# Patient Record
Sex: Male | Born: 1950 | Race: Asian | Marital: Married | Smoking: Never smoker
Health system: Southern US, Community
[De-identification: ages and names within clinical notes are randomized; demographics above are authoritative.]

## PROBLEM LIST (undated history)

## (undated) DIAGNOSIS — I10 Essential (primary) hypertension: Secondary | ICD-10-CM

## (undated) DIAGNOSIS — E785 Hyperlipidemia, unspecified: Secondary | ICD-10-CM

## (undated) HISTORY — DX: Hyperlipidemia, unspecified: E78.5

## (undated) HISTORY — DX: Essential (primary) hypertension: I10

---

## 2020-10-12 DIAGNOSIS — R7301 Impaired fasting glucose: Secondary | ICD-10-CM | POA: Diagnosis not present

## 2020-10-12 DIAGNOSIS — E559 Vitamin D deficiency, unspecified: Secondary | ICD-10-CM | POA: Diagnosis not present

## 2020-10-12 DIAGNOSIS — E7800 Pure hypercholesterolemia, unspecified: Secondary | ICD-10-CM | POA: Diagnosis not present

## 2021-01-18 DIAGNOSIS — I10 Essential (primary) hypertension: Secondary | ICD-10-CM | POA: Diagnosis not present

## 2021-01-18 DIAGNOSIS — Z1211 Encounter for screening for malignant neoplasm of colon: Secondary | ICD-10-CM | POA: Diagnosis not present

## 2021-01-18 DIAGNOSIS — E118 Type 2 diabetes mellitus with unspecified complications: Secondary | ICD-10-CM | POA: Diagnosis not present

## 2021-01-18 DIAGNOSIS — Z23 Encounter for immunization: Secondary | ICD-10-CM | POA: Diagnosis not present

## 2021-01-18 DIAGNOSIS — E559 Vitamin D deficiency, unspecified: Secondary | ICD-10-CM | POA: Diagnosis not present

## 2021-01-18 DIAGNOSIS — R0609 Other forms of dyspnea: Secondary | ICD-10-CM | POA: Diagnosis not present

## 2021-01-18 DIAGNOSIS — E7800 Pure hypercholesterolemia, unspecified: Secondary | ICD-10-CM | POA: Diagnosis not present

## 2021-01-19 ENCOUNTER — Ambulatory Visit: Payer: Medicare Other | Admitting: Cardiology

## 2021-01-19 ENCOUNTER — Encounter: Payer: Self-pay | Admitting: Cardiology

## 2021-01-19 ENCOUNTER — Other Ambulatory Visit: Payer: Self-pay

## 2021-01-19 VITALS — BP 143/80 | HR 88 | Temp 98.0°F | Resp 16 | Ht 70.0 in | Wt 217.0 lb

## 2021-01-19 NOTE — Progress Notes (Deleted)
   Primary Physician/Referring:  No primary care provider on file.  Patient ID: Keith Gomez, male    DOB: [birth year], 70 y.o.   MRN: [id number]  No chief complaint on file.  HPI:    Keith Gomez  is a 70 y.o. ***  No past medical history on file. *** The histories are not reviewed yet. Please review them in the "History" navigator section and refresh this SmartLink. No family history on file.  Social History   Tobacco Use   Smoking status: Not on file   Smokeless tobacco: Not on file  Substance Use Topics   Alcohol use: Not on file   Marital Status:    ROS  ***ROS Objective  There were no vitals taken for this visit. There is no height or weight on file to calculate BMI.  No flowsheet data found.   ***Physical Exam   Laboratory examination:   No results for input(s): NA, K, CL, CO2, GLUCOSE, BUN, CREATININE, CALCIUM, GFRNONAA, GFRAA in the last 8760 hours. CrCl cannot be calculated (No successful lab value found.).  No flowsheet data found. No flowsheet data found.  Lipid Panel No results for input(s): CHOL, TRIG, LDLCALC, VLDL, HDL, CHOLHDL, LDLDIRECT in the last 8760 hours. Lipid Panel  No results found for: CHOL, TRIG, HDL, CHOLHDL, VLDL, LDLCALC, LDLDIRECT, LABVLDL   HEMOGLOBIN A1C No results found for: HGBA1C, MPG TSH No results for input(s): TSH in the last 8760 hours.  External labs:   *** Medications and allergies  Not on File   Medication prior to this encounter:   No outpatient medications prior to visit.   No facility-administered medications prior to visit.     Medication list after today's encounter   No current outpatient medications  Radiology:   No results found.  Cardiac Studies:   ***  EKG:   ***  ***  Assessment  No diagnosis found.   There are no discontinued medications.  No orders of the defined types were placed in this encounter.  No orders of the defined types were placed in this  encounter.  Recommendations:   Keith Gomez is a 70 y.o. ***    Yates Decamp, MD, [facility] 01/19/2021, 6:39 AM Office: [phone]

## 2021-01-20 ENCOUNTER — Encounter: Payer: Self-pay | Admitting: Cardiology

## 2021-01-20 ENCOUNTER — Ambulatory Visit: Payer: Medicare Other | Admitting: Cardiology

## 2021-01-20 VITALS — BP 139/86 | HR 77 | Temp 98.5°F | Resp 17 | Ht 70.0 in | Wt 217.0 lb

## 2021-01-20 DIAGNOSIS — E119 Type 2 diabetes mellitus without complications: Secondary | ICD-10-CM

## 2021-01-20 DIAGNOSIS — R0609 Other forms of dyspnea: Secondary | ICD-10-CM | POA: Diagnosis not present

## 2021-01-20 DIAGNOSIS — E7800 Pure hypercholesterolemia, unspecified: Secondary | ICD-10-CM

## 2021-01-20 DIAGNOSIS — E6609 Other obesity due to excess calories: Secondary | ICD-10-CM

## 2021-01-20 DIAGNOSIS — I10 Essential (primary) hypertension: Secondary | ICD-10-CM | POA: Diagnosis not present

## 2021-01-20 DIAGNOSIS — Z6831 Body mass index (BMI) 31.0-31.9, adult: Secondary | ICD-10-CM

## 2021-01-20 DIAGNOSIS — R0600 Dyspnea, unspecified: Secondary | ICD-10-CM

## 2021-01-20 NOTE — Progress Notes (Signed)
Primary Physician/Referring:  Macarthur Critchley, MD  Patient ID: Keith Gomez, male    DOB: 1950-12-08, 70 y.o.   MRN: [id number]  Chief Complaint  Patient presents with   New Patient (Initial Visit)   Shortness of Breath    HPI:    Keith Gomez  is a 70 y.o. [place] male patient with hypertension, hyperlipidemia, vitamin D deficiency, diet-controlled diabetes mellitus with A1c of 6.5% recently referred to me for evaluation of chronic dyspnea on exertion and reduced physical activity tolerance.  Keith Gomez is accompanied by his wife, states that Keith Gomez is able to do most activities with mild limitation due to dyspnea on exertion and Keith Gomez is also noticed gradually decreasing exercise tolerance over several years.  Keith Gomez is a non-smoker  Past Medical History:  Diagnosis Date   Hyperlipidemia    Hypertension    History reviewed. No pertinent surgical history. Family History  Problem Relation Age of Onset   Liver disease Father    Heart attack Brother     Social History   Tobacco Use   Smoking status: Never   Smokeless tobacco: Never  Substance Use Topics   Alcohol use: Never   Marital Status:    ROS  Review of Systems  Cardiovascular:  Positive for dyspnea on exertion. Negative for chest pain and leg swelling.  Respiratory:  Positive for snoring.   Gastrointestinal:  Negative for melena.  Objective  Blood pressure 139/86, pulse 77, temperature 98.5 F (36.9 C), temperature source Temporal, resp. rate 17, height 5\' 10"  (1.778 m), weight 217 lb (98.4 kg), SpO2 98 %. Body mass index is 31.14 kg/m.  Vitals with BMI 01/20/2021 01/19/2021  Height 5\' 10"  5\' 10"   Weight 217 lbs 217 lbs  BMI 31.14 31.14  Systolic 139 143  Diastolic 86 80  Pulse 77 88     Physical Exam Constitutional:      Appearance: Keith Gomez is obese.  Neck:     Vascular: No carotid bruit or JVD.  Cardiovascular:     Rate and Rhythm: Normal rate and regular rhythm.     Pulses: Intact distal pulses.      Heart sounds: Normal heart sounds. No murmur heard.   No gallop.  Pulmonary:     Effort: Pulmonary effort is normal.     Breath sounds: Normal breath sounds.  Abdominal:     General: Bowel sounds are normal.     Palpations: Abdomen is soft.  Musculoskeletal:        General: No swelling.     Laboratory examination:   External labs:   Cholesterol, total 135.000 m 01/18/2021 HDL 39.000 mg 01/18/2021 LDL 81.000 mg 01/18/2021 Triglycerides 76.000 mg 01/18/2021  A1C 6.500 % 01/18/2021  Hemoglobin 16.000 g/d 10/01/2019  Creatinine, Serum 0.990 mg/ 01/18/2021 Potassium 4.500 mm 01/18/2021 ALT (SGPT) 19.000 U/L 01/18/2021  Medications and allergies  No Known Allergies   Medication prior to this encounter:   Outpatient Medications Prior to Visit  Medication Sig Dispense Refill   atorvastatin (LIPITOR) 20 MG tablet Take 20 mg by mouth daily.     fluticasone (FLONASE) 50 MCG/ACT nasal spray 1 spray in each nostril     loratadine (CLARITIN) 10 MG tablet 1 tablet     losartan (COZAAR) 50 MG tablet Take 50 mg by mouth daily.     Vitamin D, Ergocalciferol, (DRISDOL) 1.25 MG (50000 UNIT) CAPS capsule Take 50,000 Units by mouth once a week.     No facility-administered medications prior to visit.  Medication list after today's encounter   Current Outpatient Medications  Medication Instructions   atorvastatin (LIPITOR) 20 mg, Oral, Daily   fluticasone (FLONASE) 50 MCG/ACT nasal spray 1 spray in each nostril   loratadine (CLARITIN) 10 MG tablet 1 tablet   losartan (COZAAR) 50 mg, Oral, Daily   Vitamin D (Ergocalciferol) (DRISDOL) 50,000 Units, Oral, Weekly   Radiology:   No results found.  Cardiac Studies:   NA  EKG:   EKG 01/19/2021: Normal sinus rhythm at rate of 87 bpm, normal axis.  Incomplete right bundle branch block.  Nonspecific inferior T wave abnormality.    Assessment     ICD-10-CM   1. Dyspnea on exertion  R06.00 PCV ECHOCARDIOGRAM COMPLETE    PCV CARDIAC STRESS TEST     CT CARDIAC SCORING ([facility] LOCATIONS ONLY)    2. Hypercholesteremia  E78.00     3. Primary hypertension  I10     4. Type 2 diabetes mellitus without complication, without long-term current use of insulin (HCC)  E11.9     5. Class 1 obesity due to excess calories without serious comorbidity with body mass index (BMI) of 31.0 to 31.9 in adult  E66.09    Z68.31       There are no discontinued medications.  No orders of the defined types were placed in this encounter.  Orders Placed This Encounter  Procedures   CT CARDIAC SCORING ([facility] LOCATIONS ONLY)    Standing Status:   Future    Standing Expiration Date:   03/22/2021    Order Specific Question:   Preferred imaging location?    Answer:   [facility]   PCV CARDIAC STRESS TEST    Standing Status:   Future    Standing Expiration Date:   03/22/2021   PCV ECHOCARDIOGRAM COMPLETE    Standing Status:   Future    Standing Expiration Date:   01/20/2022   Recommendations:   Keith Gomez is a 70 y.o. [place] male patient with hypertension, hyperlipidemia, vitamin D deficiency, diet-controlled diabetes mellitus with A1c of 6.5% on 01/18/2021 referred to me for evaluation of chronic dyspnea on exertion and reduced physical activity tolerance.  Keith Gomez is accompanied by his wife, states that Keith Gomez is able to do most activities with mild limitation due to dyspnea on exertion and Keith Gomez is also noticed gradually decreasing exercise tolerance.  Keith Gomez is a non-smoker, but has significant cardiovascular is factors however lipids are well controlled, blood pressure is slightly elevated, Keith Gomez does have mild obesity and diabetes mellitus as risk factors.  His physical examination and EKG are unremarkable.  I have recommended coronary calcium score and a routine treadmill exercise stress test along with an echocardiogram.  I had an extensive discussion with the patient and his wife at the bedside regarding reducing calorie intake and also discussed regarding healthy eating habits  so his blood pressure and hyperglycemia could also improve.  I will follow-up on his blood pressure and on his next office visit if still elevated, will consider either addition of second agent or increasing the dose of the losartan.  To give time for making lifestyle changes, I will see him back in 3 months as his symptoms are ongoing for several years.    Yates Decamp, MD, [facility] 01/20/2021, 3:40 PM Office: [phone]

## 2021-01-31 ENCOUNTER — Other Ambulatory Visit: Payer: Self-pay | Admitting: Cardiology

## 2021-01-31 DIAGNOSIS — R0600 Dyspnea, unspecified: Secondary | ICD-10-CM

## 2021-01-31 DIAGNOSIS — R0609 Other forms of dyspnea: Secondary | ICD-10-CM

## 2021-02-07 DIAGNOSIS — Z23 Encounter for immunization: Secondary | ICD-10-CM | POA: Diagnosis not present

## 2021-02-07 DIAGNOSIS — S81011A Laceration without foreign body, right knee, initial encounter: Secondary | ICD-10-CM | POA: Diagnosis not present

## 2021-02-12 NOTE — Progress Notes (Signed)
Patient rescheduled for next day

## 2021-02-14 DIAGNOSIS — E118 Type 2 diabetes mellitus with unspecified complications: Secondary | ICD-10-CM | POA: Diagnosis not present

## 2021-02-14 DIAGNOSIS — E7800 Pure hypercholesterolemia, unspecified: Secondary | ICD-10-CM | POA: Diagnosis not present

## 2021-02-14 DIAGNOSIS — E559 Vitamin D deficiency, unspecified: Secondary | ICD-10-CM | POA: Diagnosis not present

## 2021-02-14 DIAGNOSIS — I10 Essential (primary) hypertension: Secondary | ICD-10-CM | POA: Diagnosis not present

## 2021-02-14 DIAGNOSIS — R0609 Other forms of dyspnea: Secondary | ICD-10-CM | POA: Diagnosis not present

## 2021-04-13 ENCOUNTER — Inpatient Hospital Stay: Admission: RE | Admit: 2021-04-13 | Payer: Medicare Other | Source: Ambulatory Visit

## 2021-04-21 ENCOUNTER — Ambulatory Visit: Payer: Medicare Other

## 2021-04-21 ENCOUNTER — Other Ambulatory Visit: Payer: Self-pay

## 2021-04-21 DIAGNOSIS — R0609 Other forms of dyspnea: Secondary | ICD-10-CM | POA: Diagnosis not present

## 2021-05-01 ENCOUNTER — Ambulatory Visit: Payer: Medicare Other | Admitting: Cardiology

## 2021-05-10 ENCOUNTER — Ambulatory Visit: Payer: Medicare Other | Admitting: Cardiology

## 2021-05-10 ENCOUNTER — Encounter: Payer: Self-pay | Admitting: Cardiology

## 2021-05-10 ENCOUNTER — Other Ambulatory Visit: Payer: Self-pay

## 2021-05-10 VITALS — BP 127/88 | HR 68 | Temp 98.1°F | Ht 70.0 in | Wt 220.0 lb

## 2021-05-10 DIAGNOSIS — I77810 Thoracic aortic ectasia: Secondary | ICD-10-CM | POA: Diagnosis not present

## 2021-05-10 DIAGNOSIS — I10 Essential (primary) hypertension: Secondary | ICD-10-CM

## 2021-05-10 DIAGNOSIS — R0609 Other forms of dyspnea: Secondary | ICD-10-CM

## 2021-05-10 NOTE — Progress Notes (Signed)
Primary Physician/Referring:  Keith Critchley, MD  Patient ID: Keith Gomez, male    DOB: Aug 11, 1950, 70 y.o.   MRN: [id number]  Chief Complaint  Patient presents with   Shortness of Breath   Follow-up   Results    HPI:    Keith Gomez  is a 70 y.o. [place] male patient with hypertension, hyperlipidemia, vitamin D deficiency, diet-controlled diabetes mellitus with A1c of 6.5% recently referred to me for evaluation of chronic dyspnea on exertion and reduced physical activity tolerance.  States that he is able to do most activities with mild limitation due to dyspnea on exertion and he is also noticed gradually decreasing exercise tolerance over several years.  He is a non-smoker.  I had seen him about 3 months ago, I recommended a routine treadmill stress test and echocardiogram and coronary calcium score.  He now presents for follow-up.  No new symptoms.  Past Medical History:  Diagnosis Date   Hyperlipidemia    Hypertension    History reviewed. No pertinent surgical history. Family History  Problem Relation Age of Onset   Liver disease Father    Heart attack Brother     Social History   Tobacco Use   Smoking status: Never   Smokeless tobacco: Never  Substance Use Topics   Alcohol use: Never   Marital Status:    ROS  Review of Systems  Cardiovascular:  Positive for dyspnea on exertion. Negative for chest pain and leg swelling.  Respiratory:  Positive for snoring.   Gastrointestinal:  Negative for melena.  Objective  Blood pressure 127/88, pulse 68, temperature 98.1 F (36.7 C), temperature source Temporal, height 5\' 10"  (1.778 m), weight 220 lb (99.8 kg), SpO2 98 %. Body mass index is 31.57 kg/m.  Vitals with BMI 05/10/2021 01/20/2021 01/19/2021  Height 5\' 10"  5\' 10"  5\' 10"   Weight 220 lbs 217 lbs 217 lbs  BMI 31.57 31.14 31.14  Systolic 127 139 03/21/2021  Diastolic 88 86 80  Pulse 68 77 88     Physical Exam Constitutional:      Appearance: He is  obese.  Neck:     Vascular: No carotid bruit or JVD.  Cardiovascular:     Rate and Rhythm: Normal rate and regular rhythm.     Pulses: Intact distal pulses.     Heart sounds: Normal heart sounds. No murmur heard.   No gallop.  Pulmonary:     Effort: Pulmonary effort is normal.     Breath sounds: Normal breath sounds.  Abdominal:     General: Bowel sounds are normal.     Palpations: Abdomen is soft.  Musculoskeletal:        General: No swelling.     Laboratory examination:   External labs:   Cholesterol, total 135.000 m 01/18/2021 HDL 39.000 mg 01/18/2021 LDL 81.000 mg 01/18/2021 Triglycerides 76.000 mg 01/18/2021  A1C 6.500 % 01/18/2021  Hemoglobin 16.000 g/d 10/01/2019  Creatinine, Serum 0.990 mg/ 01/18/2021 Potassium 4.500 mm 01/18/2021 ALT (SGPT) 19.000 U/L 01/18/2021  Medications and allergies  No Known Allergies   Medication prior to this encounter:   Outpatient Medications Prior to Visit  Medication Sig Dispense Refill   aspirin EC 81 MG tablet Take 81 mg by mouth daily. Swallow whole.     atorvastatin (LIPITOR) 20 MG tablet Take 20 mg by mouth daily.     fluticasone (FLONASE) 50 MCG/ACT nasal spray 1 spray in each nostril     losartan (COZAAR) 50 MG tablet  Take 50 mg by mouth daily.     Vitamin D, Ergocalciferol, (DRISDOL) 1.25 MG (50000 UNIT) CAPS capsule Take 50,000 Units by mouth once a week.     loratadine (CLARITIN) 10 MG tablet 1 tablet     No facility-administered medications prior to visit.    Medication list after today's encounter   Current Outpatient Medications  Medication Instructions   aspirin EC 81 mg, Oral, Daily, Swallow whole.   atorvastatin (LIPITOR) 20 mg, Oral, Daily   fluticasone (FLONASE) 50 MCG/ACT nasal spray 1 spray in each nostril   losartan (COZAAR) 50 mg, Oral, Daily   Vitamin D (Ergocalciferol) (DRISDOL) 50,000 Units, Oral, Weekly   Radiology:   No results found.  Cardiac Studies:   PCV ECHOCARDIOGRAM COMPLETE  04/21/2021  Narrative Echocardiogram 04/21/2021: Normal LV systolic function with visual EF 60-65%. Left ventricle cavity is normal in size. Normal left ventricular wall thickness. Normal global wall motion. Normal diastolic filling pattern, normal LAP. No significant valvular heart disease. Proximal aorta mildly dilated at 75mm. No prior study for comparison.    PCV CARDIAC STRESS TEST 04/21/2021  Narrative Exercise treadmill stress test 04/21/2021: Functional status: Good. Chest pain: No. Reason for stopping exercise: Fatigue/weakness. Hypertensive response to exercise: No. Exercise time 7 minutes 00 seconds on Bruce protocol, achieved 8.6 METS, 100% of age-predicted maximum heart rate. Stress ECG negative for ischemia. Low risk study.    EKG:   EKG 01/19/2021: Normal sinus rhythm at rate of 87 bpm, normal axis.  Incomplete right bundle branch block.  Nonspecific inferior T wave abnormality.    Assessment     ICD-10-CM   1. Aortic root dilatation (HCC)  I77.810 PCV ECHOCARDIOGRAM COMPLETE    2. Dyspnea on exertion  R06.09 PCV ECHOCARDIOGRAM COMPLETE    3. Primary hypertension  I10       Medications Discontinued During This Encounter  Medication Reason   loratadine (CLARITIN) 10 MG tablet     No orders of the defined types were placed in this encounter.   Orders Placed This Encounter  Procedures   PCV ECHOCARDIOGRAM COMPLETE    Standing Status:   Future    Standing Expiration Date:   05/10/2022    Scheduling Instructions:     Prior to next OV in a year   Recommendations:   Keith Gomez is a 70 y.o. [place] male patient with hypertension, hyperlipidemia, vitamin D deficiency, diet-controlled diabetes mellitus with A1c of 6.5% on 01/18/2021 referred to me for evaluation of chronic dyspnea on exertion and reduced physical activity tolerance.  He is accompanied by his wife, states that he is able to do most activities with mild limitation due to dyspnea on  exertion and he is also noticed gradually decreasing exercise tolerance.  He is a non-smoker, but has significant cardiovascular is factors however lipids are well controlled, blood pressure is slightly elevated, he does have mild obesity and diet diabetes mellitus as risk factors.  His physical examination and EKG are unremarkable.  I have recommended coronary calcium score and a routine treadmill exercise stress test along with an echocardiogram.  He [place] however I reviewed the results of the stress test and echocardiogram.  He has normal/low risk treadmill exercise stress test however echocardiogram reveals aortic root dilatation.  Again coronary calcium score will also in addition given the measurements of the aortic root.  He is willing to get this done soon.  If  indeed he has high coronary calcium score, I may consider reducing his LDL even further to <70.  I will repeat echocardiogram in a year and I will see him back at that time.      Keith Decamp, MD, [facility] 05/10/2021, 12:20 PM Office: [phone]

## 2021-05-17 DIAGNOSIS — H25013 Cortical age-related cataract, bilateral: Secondary | ICD-10-CM | POA: Diagnosis not present

## 2021-05-17 DIAGNOSIS — H2513 Age-related nuclear cataract, bilateral: Secondary | ICD-10-CM | POA: Diagnosis not present

## 2021-05-17 DIAGNOSIS — E119 Type 2 diabetes mellitus without complications: Secondary | ICD-10-CM | POA: Diagnosis not present

## 2021-05-17 DIAGNOSIS — H524 Presbyopia: Secondary | ICD-10-CM | POA: Diagnosis not present

## 2021-06-07 ENCOUNTER — Ambulatory Visit
Admission: RE | Admit: 2021-06-07 | Discharge: 2021-06-07 | Disposition: A | Discharge status: Home / Self Care | Payer: Self-pay | Source: Ambulatory Visit | Attending: Cardiology | Admitting: Cardiology

## 2021-06-07 DIAGNOSIS — R0609 Other forms of dyspnea: Secondary | ICD-10-CM

## 2021-06-10 ENCOUNTER — Other Ambulatory Visit: Payer: Self-pay | Admitting: Cardiology

## 2021-06-10 DIAGNOSIS — R931 Abnormal findings on diagnostic imaging of heart and coronary circulation: Secondary | ICD-10-CM

## 2021-06-10 DIAGNOSIS — E7800 Pure hypercholesterolemia, unspecified: Secondary | ICD-10-CM

## 2021-06-10 MED ORDER — ATORVASTATIN CALCIUM 40 MG PO TABS: 40.0000 mg | ORAL_TABLET | Freq: Every day | ORAL | 3 refills | Status: DC

## 2021-06-10 NOTE — Progress Notes (Signed)
Coronary calcium score 06/07/2021: LM 71.2, LAD 225, LCx 1.9, RCA 168, Total Agatston score 467.  [place] database percentile 89. Ascending aorta is mildly dilated at 38 mm and descending thoracic aorta normal.  Visualized extracardiac structures normal. Will increase atorvastatin from 20 mg to 40 mg in view of elevated coronary calcium score.  Prescription sent.    ICD-10-CM   1. Hypercholesteremia  E78.00 atorvastatin (LIPITOR) 40 MG tablet    2. Elevated coronary artery calcium score 06/07/21 Total Agatston score 467, [place] database 89th percentile  R93.1 atorvastatin (LIPITOR) 40 MG tablet

## 2021-06-10 NOTE — Progress Notes (Signed)
Coronary calcium score 06/07/2021: LM 71.2, LAD 225, LCx 1.9, RCA 168, Total Agatston score 467.  [place] database percentile 89. Ascending aorta is mildly dilated at 38 mm and descending thoracic aorta normal.  Visualized extracardiac structures normal. Will increase atorvastatin from 10 mg to 20 mg in view of elevated coronary calcium score.  Prescription sent.

## 2021-06-10 NOTE — Progress Notes (Signed)
Let him know his coronary calcium score was elevated, I have sent in a new prescription for higher dose of atorvastatin, he can double up on 20 mg dose he has until he is done and then can start with 40 mg new Rx

## 2021-06-12 NOTE — Progress Notes (Signed)
Called and spoke with patient regarding his CT cardiac scoring results. Patient will double up and when finished, he will start the 40mg .

## 2021-07-31 DIAGNOSIS — E7800 Pure hypercholesterolemia, unspecified: Secondary | ICD-10-CM | POA: Diagnosis not present

## 2021-07-31 DIAGNOSIS — E559 Vitamin D deficiency, unspecified: Secondary | ICD-10-CM | POA: Diagnosis not present

## 2021-07-31 DIAGNOSIS — E118 Type 2 diabetes mellitus with unspecified complications: Secondary | ICD-10-CM | POA: Diagnosis not present

## 2021-08-10 DIAGNOSIS — E7800 Pure hypercholesterolemia, unspecified: Secondary | ICD-10-CM | POA: Diagnosis not present

## 2021-08-10 DIAGNOSIS — Z7185 Encounter for immunization safety counseling: Secondary | ICD-10-CM | POA: Diagnosis not present

## 2021-08-10 DIAGNOSIS — I10 Essential (primary) hypertension: Secondary | ICD-10-CM | POA: Diagnosis not present

## 2021-08-10 DIAGNOSIS — J302 Other seasonal allergic rhinitis: Secondary | ICD-10-CM | POA: Diagnosis not present

## 2021-08-10 DIAGNOSIS — I2510 Atherosclerotic heart disease of native coronary artery without angina pectoris: Secondary | ICD-10-CM | POA: Diagnosis not present

## 2021-08-10 DIAGNOSIS — E118 Type 2 diabetes mellitus with unspecified complications: Secondary | ICD-10-CM | POA: Diagnosis not present

## 2022-02-21 DIAGNOSIS — B359 Dermatophytosis, unspecified: Secondary | ICD-10-CM | POA: Diagnosis not present

## 2022-02-21 DIAGNOSIS — I10 Essential (primary) hypertension: Secondary | ICD-10-CM | POA: Diagnosis not present

## 2022-02-21 DIAGNOSIS — E559 Vitamin D deficiency, unspecified: Secondary | ICD-10-CM | POA: Diagnosis not present

## 2022-02-21 DIAGNOSIS — Z23 Encounter for immunization: Secondary | ICD-10-CM | POA: Diagnosis not present

## 2022-02-21 DIAGNOSIS — E7800 Pure hypercholesterolemia, unspecified: Secondary | ICD-10-CM | POA: Diagnosis not present

## 2022-02-21 DIAGNOSIS — Z0000 Encounter for general adult medical examination without abnormal findings: Secondary | ICD-10-CM | POA: Diagnosis not present

## 2022-02-21 DIAGNOSIS — Z1211 Encounter for screening for malignant neoplasm of colon: Secondary | ICD-10-CM | POA: Diagnosis not present

## 2022-02-21 DIAGNOSIS — E118 Type 2 diabetes mellitus with unspecified complications: Secondary | ICD-10-CM | POA: Diagnosis not present

## 2022-05-04 ENCOUNTER — Other Ambulatory Visit: Payer: Medicare Other

## 2022-05-10 ENCOUNTER — Encounter: Payer: Self-pay | Admitting: Cardiology

## 2022-05-10 ENCOUNTER — Ambulatory Visit: Payer: Medicare Other | Admitting: Cardiology

## 2022-05-10 ENCOUNTER — Other Ambulatory Visit: Payer: Medicare Other

## 2022-05-10 VITALS — BP 123/73 | HR 73 | Ht 70.0 in | Wt 220.0 lb

## 2022-05-10 DIAGNOSIS — R931 Abnormal findings on diagnostic imaging of heart and coronary circulation: Secondary | ICD-10-CM | POA: Diagnosis not present

## 2022-05-10 DIAGNOSIS — I77810 Thoracic aortic ectasia: Secondary | ICD-10-CM | POA: Diagnosis not present

## 2022-05-10 DIAGNOSIS — E7800 Pure hypercholesterolemia, unspecified: Secondary | ICD-10-CM

## 2022-05-10 DIAGNOSIS — I10 Essential (primary) hypertension: Secondary | ICD-10-CM | POA: Diagnosis not present

## 2022-05-10 NOTE — Progress Notes (Signed)
Primary Physician/Referring:  Macarthur Critchley, MD  Patient ID: Keith Gomez, male    DOB: [birth year], 71 y.o.   MRN: [id number]  Chief Complaint  Patient presents with   Aortic root dilatation   Follow-up    1 year    HPI:    [name]  is a 71 y.o.  [place] male patient with hypertension, hyperlipidemia, vitamin D deficiency, diet-controlled diabetes mellitus, elevated coronary calcium score in the 89th percentile in January 2023, very mild ascending aortic dilatation at 3.8 cm, seen for chronic dyspnea on exertion and reduced physical activity tolerance.  Continue doing well and remains asymptomatic except for mild chronic dyspnea.  Past Medical History:  Diagnosis Date   Hyperlipidemia    Hypertension    No past surgical history on file. Family History  Problem Relation Age of Onset   Liver disease Father    Heart attack Brother     Social History   Tobacco Use   Smoking status: Never   Smokeless tobacco: Never  Substance Use Topics   Alcohol use: Never   Marital Status:    ROS  Review of Systems  Cardiovascular:  Positive for dyspnea on exertion. Negative for chest pain and leg swelling.  Respiratory:  Positive for snoring.   Gastrointestinal:  Negative for melena.   Objective  There were no vitals taken for this visit. There is no height or weight on file to calculate BMI.     05/10/2021   11:18 AM 01/20/2021   11:33 AM 01/19/2021    8:24 AM  Vitals with BMI  Height 5\' 10"  5\' 10"  5\' 10"   Weight 220 lbs 217 lbs 217 lbs  BMI 31.57 31.14 31.14  Systolic 127 139  Diastolic 88 86 80  Pulse 68 77 88     Physical Exam Constitutional:      Appearance: He is obese.  Neck:     Vascular: No carotid bruit or JVD.  Cardiovascular:     Rate and Rhythm: Normal rate and regular rhythm.     Pulses: Intact distal pulses.     Heart sounds: Normal heart sounds. No murmur heard.    No gallop.  Pulmonary:     Effort: Pulmonary effort is  normal.     Breath sounds: Normal breath sounds.  Abdominal:     General: Bowel sounds are normal.     Palpations: Abdomen is soft.  Musculoskeletal:     Right lower leg: No edema.     Left lower leg: No edema.      Laboratory examination:   External labs:   Cholesterol, total 132.000 m 02/21/2022 HDL 46.000 mg 02/21/2022 LDL 70.000 mg 02/21/2022 Triglycerides 83.000 mg 02/21/2022  A1C 6.600 % 02/21/2022  Creatinine, Serum 0.950 mg/ 02/21/2022 Potassium 4.300 mm 02/21/2022 ALT (SGPT) 26.000 U/L 02/21/2022  Medications and allergies  No Known Allergies    Medication list after today's encounter   Current Outpatient Medications:    aspirin EC 81 MG tablet, Take 81 mg by mouth daily. Swallow whole., Disp: , Rfl:    atorvastatin (LIPITOR) 40 MG tablet, Take 1 tablet (40 mg total) by mouth daily., Disp: 90 tablet, Rfl: 3   fluticasone (FLONASE) 50 MCG/ACT nasal spray, 1 spray in each nostril, Disp: , Rfl:    losartan (COZAAR) 50 MG tablet, Take 50 mg by mouth daily., Disp: , Rfl:    Vitamin D, Ergocalciferol, (DRISDOL) 1.25 MG (50000 UNIT) CAPS capsule, Take 50,000 Units by mouth  once a week., Disp: , Rfl:   Radiology:   Coronary calcium score 06/07/2021: LM 71.2, LAD 225, LCx 1.9, RCA 168, Total Agatston score 467.  [place] database percentile 89. Ascending aorta is mildly dilated at 38 mm and descending thoracic aorta normal.  Visualized extracardiac structures normal.  Cardiac Studies:   PCV ECHOCARDIOGRAM COMPLETE 04/21/2021  Narrative Echocardiogram 04/21/2021: Normal LV systolic function with visual EF 60-65%. Left ventricle cavity is normal in size. Normal left ventricular wall thickness. Normal global wall motion. Normal diastolic filling pattern, normal LAP. No significant valvular heart disease. Proximal aorta mildly dilated at 76mm. No prior study for comparison.    PCV CARDIAC STRESS TEST 04/21/2021  Narrative Exercise treadmill stress test  04/21/2021: Functional status: Good. Chest pain: No. Reason for stopping exercise: Fatigue/weakness. Hypertensive response to exercise: No. Exercise time 7 minutes 00 seconds on Bruce protocol, achieved 8.6 METS, 100% of age-predicted maximum heart rate. Stress ECG negative for ischemia. Low risk study.    EKG:   EKG 09/08/2021: Normal sinus rhythm at rate of 67 bpm, normal axis.  Incomplete left bundle branch block.  No evidence of ischemia, normal EKG.  No significant change from 01/19/2021.    Assessment     ICD-10-CM   1. Aortic root dilatation (HCC)  I77.810     2. Elevated coronary artery calcium score 06/07/21 Total Agatston score 467, [place] database 89th percentile  R93.1     3. Hypercholesteremia  E78.00     4. Primary hypertension  I10       There are no discontinued medications.   No orders of the defined types were placed in this encounter.   No orders of the defined types were placed in this encounter.  Recommendations:   [name] is a 71 y.o. [place] male patient with hypertension, hyperlipidemia, vitamin D deficiency, diet-controlled diabetes mellitus, elevated coronary calcium score in the 89th percentile in January 2023, very mild ascending aortic dilatation at 3.8 cm, seen for chronic dyspnea on exertion and reduced physical activity tolerance.  Continue doing well and remains asymptomatic except for mild chronic dyspnea.  1. Aortic root dilatation (HCC) Very mild aortic root dilatation, probably normal variant for his age and hypertension.  Echocardiogram is been ordered, if this remains stable, I do not think he needs annual surveillance.  2. Elevated coronary artery calcium score 06/07/21 Total Agatston score 467, [place] database 89th percentile He is now on statin therapy, lipids under excellent control.  He has not had any chest pain or dyspnea.  3. Hypercholesteremia Lipids under excellent control.  External labs reviewed.  4. Primary  hypertension Blood pressure also well-controlled on ARB.  I did not make any changes to his medications.  I will see him back in a year or sooner if problems. If he remains stable, I will see him back on a as needed basis.    Yates Decamp, MD, [facility] 05/10/2022, 11:04 AM Office: [phone]

## 2022-05-11 DIAGNOSIS — M25512 Pain in left shoulder: Secondary | ICD-10-CM | POA: Diagnosis not present

## 2022-06-12 ENCOUNTER — Other Ambulatory Visit: Payer: Self-pay | Admitting: Cardiology

## 2022-06-12 DIAGNOSIS — E7800 Pure hypercholesterolemia, unspecified: Secondary | ICD-10-CM

## 2022-06-12 DIAGNOSIS — R931 Abnormal findings on diagnostic imaging of heart and coronary circulation: Secondary | ICD-10-CM

## 2022-12-19 DIAGNOSIS — E559 Vitamin D deficiency, unspecified: Secondary | ICD-10-CM | POA: Diagnosis not present

## 2022-12-19 DIAGNOSIS — I2510 Atherosclerotic heart disease of native coronary artery without angina pectoris: Secondary | ICD-10-CM | POA: Diagnosis not present

## 2022-12-19 DIAGNOSIS — E118 Type 2 diabetes mellitus with unspecified complications: Secondary | ICD-10-CM | POA: Diagnosis not present

## 2022-12-19 DIAGNOSIS — E7800 Pure hypercholesterolemia, unspecified: Secondary | ICD-10-CM | POA: Diagnosis not present

## 2022-12-19 DIAGNOSIS — I10 Essential (primary) hypertension: Secondary | ICD-10-CM | POA: Diagnosis not present

## 2023-04-23 ENCOUNTER — Ambulatory Visit: Payer: Medicare Other | Attending: Cardiology | Admitting: Cardiology

## 2023-04-23 NOTE — Progress Notes (Unsigned)
Cardiology Office Note:  .   Date:  04/23/2023  ID:  Keith Gomez, DOB 25-Jun-1950, MRN [id number] PCP: Keith Critchley, MD  [facility] Providers Cardiologist:  None { Click to update primary MD,subspecialty MD or APP then REFRESH:1}  History of Present Illness: .   Keith Gomez is a 72 y.o. [place] male patient with hypertension, hyperlipidemia, vitamin D deficiency, diet-controlled diabetes mellitus, elevated coronary calcium score in the 89th percentile in January 2023, very mild ascending aortic dilatation at 3.8 cm, seen for chronic dyspnea on exertion and reduced physical activity tolerance.   Discussed the use of AI scribe software for clinical note transcription with the patient, who gave verbal consent to proceed.  History of Present Illness            ROS  Labs   No results found for: "CHOL", "HDL", "LDLCALC", "LDLDIRECT", "TRIG", "CHOLHDL" No results found for: "NA", "K", "CO2", "GLUCOSE", "BUN", "CREATININE", "CALCIUM", "GFR", "EGFR", "GFRNONAA"     No data to display             No data to display         External Labs:  Labs 12/20/2022:  Vitamin D43.6.  A1c 6.8%.  Serum glucose 125 mg, BUN 22, creatinine 0.99, EGFR 81 mL, potassium 4.5, LFTs normal.  Total cholesterol 135, triglycerides 96, HDL 46, LDL 72.  Physical Exam:   VS:  There were no vitals taken for this visit.   Wt Readings from Last 3 Encounters:  05/10/22 220 lb (99.8 kg)  05/10/21 220 lb (99.8 kg)  01/20/21 217 lb (98.4 kg)     Physical Exam  Studies Reviewed: Marland Kitchen    Coronary calcium score 06/07/2021: LM 71.2, LAD 225, LCx 1.9, RCA 168, Total Agatston score 467.  [place] database percentile 89. Ascending aorta is mildly dilated at 38 mm and descending thoracic aorta normal.  Visualized extracardiac structures normal.  Echocardiogram 04/21/2021: Normal LV systolic function with visual EF 60-65%. Left ventricle cavity is normal in size. Normal left  ventricular wall thickness. Normal global wall motion. Normal diastolic filling pattern, normal LAP. No significant valvular heart disease. Proximal aorta mildly dilated at 39mm. No prior study for comparison.    PCV CARDIAC STRESS TEST 04/21/2021   Functional status: Good. Chest pain: No. Reason for stopping exercise: Fatigue/weakness. Hypertensive response to exercise: No. Exercise time 7 minutes 00 seconds on Bruce protocol, achieved 8.6 METS, 100% of age-predicted maximum heart rate. Stress ECG negative for ischemia. Low risk study. EKG:         EKG 09/08/2021: Normal sinus rhythm at rate of 67 bpm, normal axis. Incomplete left bundle branch block. No evidence of ischemia, normal EKG.  Medications and allergies    No Known Allergies   Current Outpatient Medications:    aspirin EC 81 MG tablet, Take 81 mg by mouth daily. Swallow whole., Disp: , Rfl:    atorvastatin (LIPITOR) 40 MG tablet, TAKE 1 TABLET BY MOUTH EVERY DAY, Disp: 90 tablet, Rfl: 3   fluticasone (FLONASE) 50 MCG/ACT nasal spray, 1 spray in each nostril, Disp: , Rfl:    losartan (COZAAR) 50 MG tablet, Take 50 mg by mouth daily., Disp: , Rfl:    Vitamin D, Ergocalciferol, (DRISDOL) 1.25 MG (50000 UNIT) CAPS capsule, Take 50,000 Units by mouth once a week., Disp: , Rfl:    ASSESSMENT AND PLAN: .      ICD-10-CM   1. Aortic root dilatation (HCC)  I77.810  2. Elevated coronary artery calcium score 06/07/21 Total Agatston score 467, [place] database 89th percentile  R93.1     3. Hypercholesteremia  E78.00     4. Primary hypertension  I10       1. Aortic root dilatation (HCC) ***  2. Elevated coronary artery calcium score 06/07/21 Total Agatston score 467, [place] database 89th percentile ***  3. Hypercholesteremia ***  4. Primary hypertension ***  Assessment and Plan                {Are you ordering a CV Procedure (e.g. stress test, cath, DCCV, TEE, etc)?   Press F2        :[id number]}    Signed,  Yates Decamp, MD, [facility] 04/23/2023, 6:27 AM [facility] [address] [place], [place] [address] Phone: [phone]. Fax:  [phone]

## 2023-04-24 ENCOUNTER — Encounter: Payer: Self-pay | Admitting: Cardiology

## 2023-09-13 DIAGNOSIS — M25511 Pain in right shoulder: Secondary | ICD-10-CM | POA: Diagnosis not present

## 2023-09-13 DIAGNOSIS — E559 Vitamin D deficiency, unspecified: Secondary | ICD-10-CM | POA: Diagnosis not present

## 2023-09-13 DIAGNOSIS — E118 Type 2 diabetes mellitus with unspecified complications: Secondary | ICD-10-CM | POA: Diagnosis not present

## 2023-09-13 DIAGNOSIS — E7800 Pure hypercholesterolemia, unspecified: Secondary | ICD-10-CM | POA: Diagnosis not present

## 2023-09-13 DIAGNOSIS — I2510 Atherosclerotic heart disease of native coronary artery without angina pectoris: Secondary | ICD-10-CM | POA: Diagnosis not present

## 2023-09-13 DIAGNOSIS — I10 Essential (primary) hypertension: Secondary | ICD-10-CM | POA: Diagnosis not present

## 2023-10-23 DIAGNOSIS — H25013 Cortical age-related cataract, bilateral: Secondary | ICD-10-CM | POA: Diagnosis not present

## 2023-10-23 DIAGNOSIS — H02831 Dermatochalasis of right upper eyelid: Secondary | ICD-10-CM | POA: Diagnosis not present

## 2023-10-23 DIAGNOSIS — H2513 Age-related nuclear cataract, bilateral: Secondary | ICD-10-CM | POA: Diagnosis not present

## 2023-10-23 DIAGNOSIS — H02834 Dermatochalasis of left upper eyelid: Secondary | ICD-10-CM | POA: Diagnosis not present

## 2023-10-23 DIAGNOSIS — E119 Type 2 diabetes mellitus without complications: Secondary | ICD-10-CM | POA: Diagnosis not present

## 2023-10-23 DIAGNOSIS — H5203 Hypermetropia, bilateral: Secondary | ICD-10-CM | POA: Diagnosis not present

## 2023-10-23 DIAGNOSIS — H52203 Unspecified astigmatism, bilateral: Secondary | ICD-10-CM | POA: Diagnosis not present

## 2023-10-24 DIAGNOSIS — M25511 Pain in right shoulder: Secondary | ICD-10-CM | POA: Diagnosis not present

## 2023-11-07 ENCOUNTER — Ambulatory Visit: Attending: Cardiology | Admitting: Cardiology

## 2023-11-07 ENCOUNTER — Encounter: Payer: Self-pay | Admitting: Cardiology

## 2023-11-07 VITALS — BP 122/71 | HR 56 | Resp 16 | Ht 70.0 in

## 2023-11-07 DIAGNOSIS — E7800 Pure hypercholesterolemia, unspecified: Secondary | ICD-10-CM | POA: Diagnosis not present

## 2023-11-07 DIAGNOSIS — R931 Abnormal findings on diagnostic imaging of heart and coronary circulation: Secondary | ICD-10-CM | POA: Diagnosis not present

## 2023-11-07 DIAGNOSIS — I10 Essential (primary) hypertension: Secondary | ICD-10-CM | POA: Diagnosis not present

## 2023-11-07 NOTE — Progress Notes (Signed)
 Cardiology Office Note:  .   Date:  11/07/2023  ID:  Keith Gomez, DOB 1951/01/01, MRN [id number] PCP: Eva Almarie Grater, MD  [facility] Providers Cardiologist:  None   History of Present Illness: .   Keith Gomez is a 73 y.o. [place] male patient with hypertension, hyperlipidemia, vitamin D deficiency, diet-controlled diabetes mellitus, elevated coronary calcium  score in the 89th percentile in January 2023, very mild ascending aortic dilatation at 3.8 cm, seen for chronic dyspnea on exertion and reduced physical activity tolerance.  Echocardiogram and stress test in December 2022 revealing normal LVEF and low normal exercise capacity at 7 minutes achieving 8.6 METS on Bruce protocol without EKG evidence of ischemia by treadmill stress test.  I had seen in 2 years ago.  Discussed the use of AI scribe software for clinical note transcription with the patient, who gave verbal consent to proceed.  History of Present Illness Keith Gomez is a 73 year old male with diabetes, hypertension, hyperlipidemia, and coronary artery disease who presents for an annual cardiovascular check-up. He was referred by Dr. Rolinda for routine cardiovascular evaluation.  He has coronary artery disease with a high coronary calcium  score in the 89th percentile and mild plaque buildup noted on a CT scan two years ago. A stress test at that time showed no EKG changes or chest pain. Hypertension is well-controlled with losartan 50 mg daily, and a recent blood pressure reading was 122/71 mmHg. Hyperlipidemia is managed with atorvastatin  40 mg, with recent cholesterol levels showing an LDL of 72 mg/dL and triglycerides at 96 mg/dL.  Diabetes management includes Jardiance 25 mg daily, initiated after increased blood sugar levels during a recent trip to [place]. Weight management efforts are ongoing, with a current weight of 213-214 lbs, up from 205 lbs during Ramadan. He has stopped eating rice,  bread, and pasta, except occasionally at social events, and acknowledges the need for regular physical activity, aiming to walk daily for 30 minutes.  Labs   External Labs:  PCP labs on Care Everywhere 09/14/2023:  Vitamin D27.3.  Serum glucose 119 mg, BUN 18, creatinine 1.23, EGFR 62 mL, potassium 4.6,  A1c 7.1%.  Labs 12/20/2022:  Total cholesterol 135, triglycerides 96, HDL 46, LDL 72.  ROS  Review of Systems  Cardiovascular:  Negative for chest pain, dyspnea on exertion and leg swelling.   Physical Exam:   VS:  There were no vitals taken for this visit.   Wt Readings from Last 3 Encounters:  05/10/22 220 lb (99.8 kg)  05/10/21 220 lb (99.8 kg)  01/20/21 217 lb (98.4 kg)    Physical Exam Neck:     Vascular: No carotid bruit or JVD.   Cardiovascular:     Rate and Rhythm: Normal rate and regular rhythm.     Pulses: Intact distal pulses.     Heart sounds: Normal heart sounds. No murmur heard.    No gallop.  Pulmonary:     Effort: Pulmonary effort is normal.     Breath sounds: Normal breath sounds.  Abdominal:     General: Bowel sounds are normal.     Palpations: Abdomen is soft.   Musculoskeletal:     Right lower leg: No edema.     Left lower leg: No edema.  Studies Reviewed: SABRA     EKG:       EKG 09/08/2021: Normal sinus rhythm at rate of 67 bpm, normal axis. Incomplete left bundle branch block.   Medications ordered  No orders of the defined types were placed in this encounter.    ASSESSMENT AND PLAN: .    No diagnosis found.  Assessment and Plan Assessment & Plan Elevated coronary calcium  score Elevated coronary calcium  score in the 89th percentile with mild plaque buildup in coronary arteries. Normal stress test results indicate no need for invasive interventions. - Continue atorvastatin  and baby aspirin - Encourage lifestyle modifications including regular exercise and dietary changes - No further cardiac evaluation needed unless  symptomatic  Type 2 diabetes mellitus Type 2 diabetes mellitus with recent increase in blood glucose levels after a trip to [place]. Initiated on Jardiance for glycemic control, cardiovascular and renal protection, and potential weight loss. - Continue Jardiance 25 mg daily - Encourage daily exercise, specifically 30 minutes of walking - Advise dietary modifications, including reducing intake of rice, bread, and pasta - Monitor blood glucose levels regularly  Hypertension Hypertension is well-controlled with current medication regimen. Blood pressure is 122/71 mmHg, within target range. - Continue losartan 50 mg once daily - Encourage regular monitoring of blood pressure  Hyperlipidemia Hyperlipidemia is well-managed with atorvastatin . LDL is 72 mg/dL, slightly above target of 70 mg/dL, but not concerning. Triglycerides are normal at 96 mg/dL. - Continue atorvastatin  40 mg daily - Advise dietary modifications to further improve lipid profile, including reducing red meat and fried foods - Encourage consumption of chicken, fish, and eggs  OV on a PRN basis   Signed,  Gordy Bergamo, MD, [facility] 11/07/2023, 6:50 AM [facility] [address] [place], [place] [address] Phone: [phone]. Fax:  [phone]

## 2023-11-07 NOTE — Patient Instructions (Signed)

## 2023-11-18 DIAGNOSIS — M25511 Pain in right shoulder: Secondary | ICD-10-CM | POA: Diagnosis not present

## 2023-11-27 DIAGNOSIS — M25511 Pain in right shoulder: Secondary | ICD-10-CM | POA: Diagnosis not present

## 2023-11-29 DIAGNOSIS — M25511 Pain in right shoulder: Secondary | ICD-10-CM | POA: Diagnosis not present

## 2023-12-03 DIAGNOSIS — M25511 Pain in right shoulder: Secondary | ICD-10-CM | POA: Diagnosis not present

## 2023-12-12 IMAGING — CT CT CARDIAC CORONARY ARTERY CALCIUM SCORE
3 series · 14 of 20 positions shown, 16 images · non-contrast
Comparison: None.

CLINICAL DATA: 70-year-old Asian male with history of hypertension
and family history of heart disease.

EXAM:
CT CARDIAC CORONARY ARTERY CALCIUM SCORE
TECHNIQUE: Non-contrast imaging through the heart was performed using
prospective ECG gating. Image post processing was performed on an
independent workstation, allowing for quantitative analysis of the
heart and coronary arteries. Note that this exam targets the heart
and the chest was not imaged in its entirety.

[Series 2: calcium scoring 2.00 qr36 bestdiast 71% hrt calciu · axial · 0.48mm/px · z∈[+1494,+1578]mm · 4 of 70 slices shown]
[im 14/70  vessel]
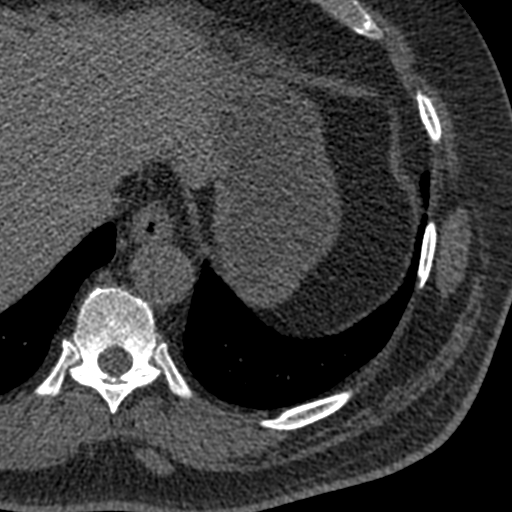
[im 28/70  vessel]
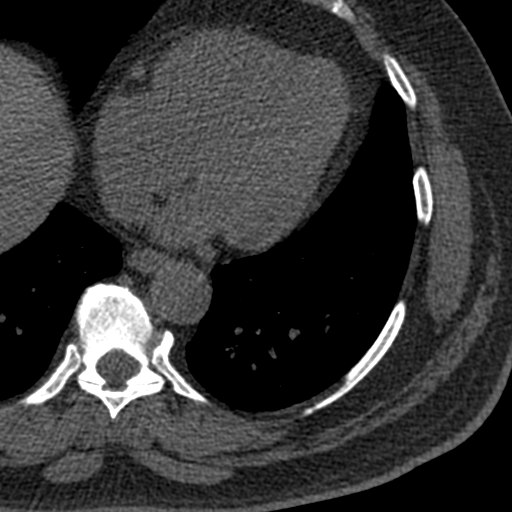
[im 42/70  vessel]
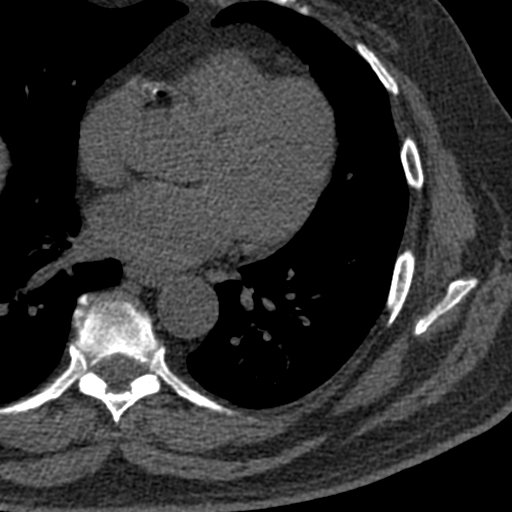
[im 56/70  vessel]
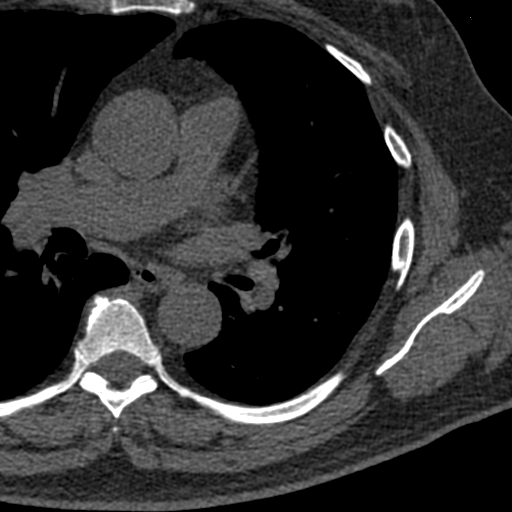

[Series 3: calcium scoring 2.00 br40 bestdiast 71% axial · axial · 0.65mm/px · z∈[+1490,+1582]mm · 5 of 70 slices shown, 7 images]
[im 12/70  vessel]
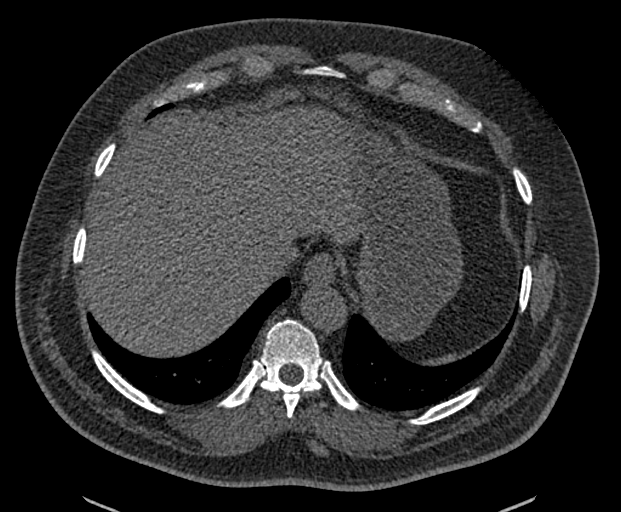
[im 12/70  lung]
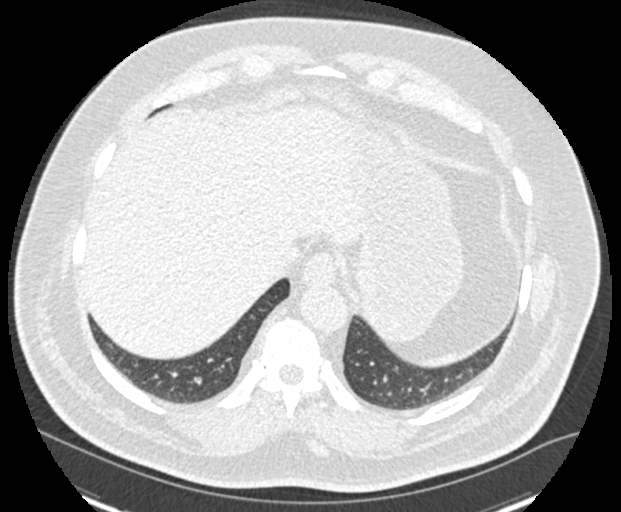
[im 24/70  vessel]
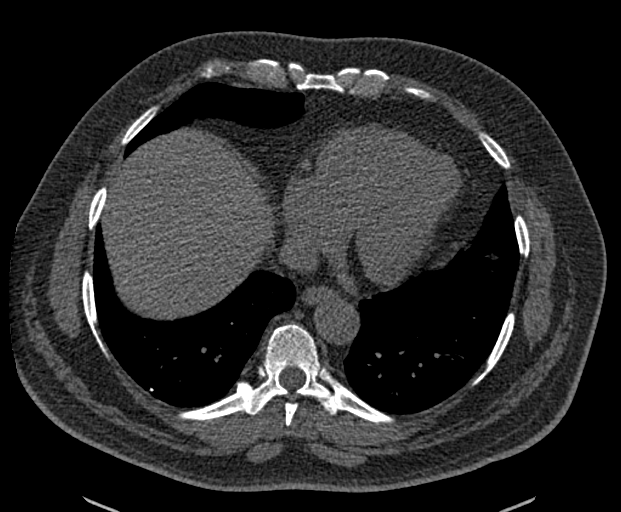
[im 35/70  vessel]
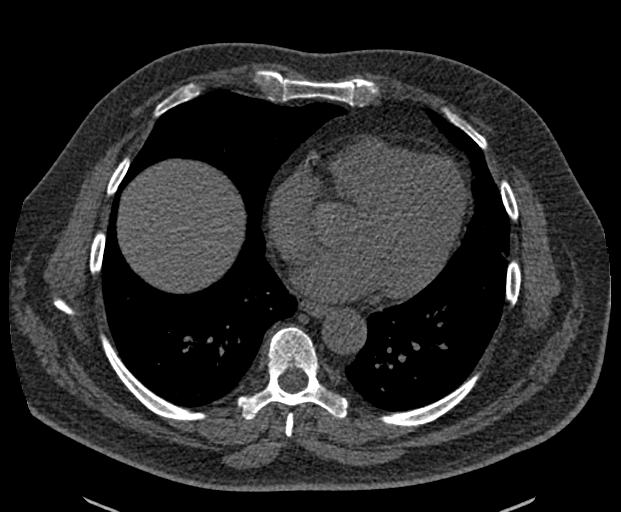
[im 47/70  vessel]
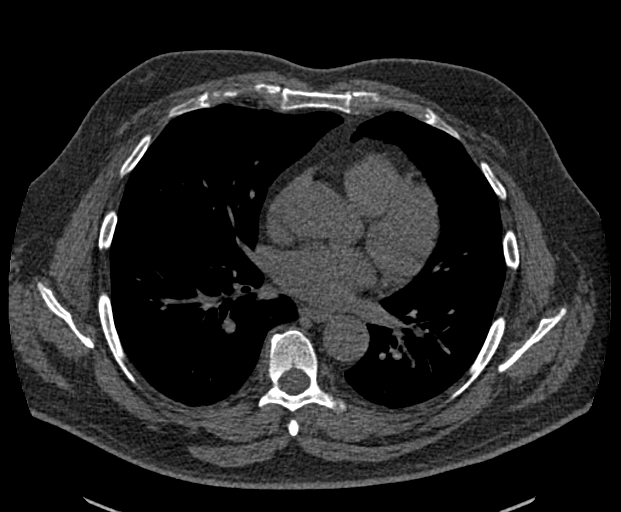
[im 58/70  vessel]
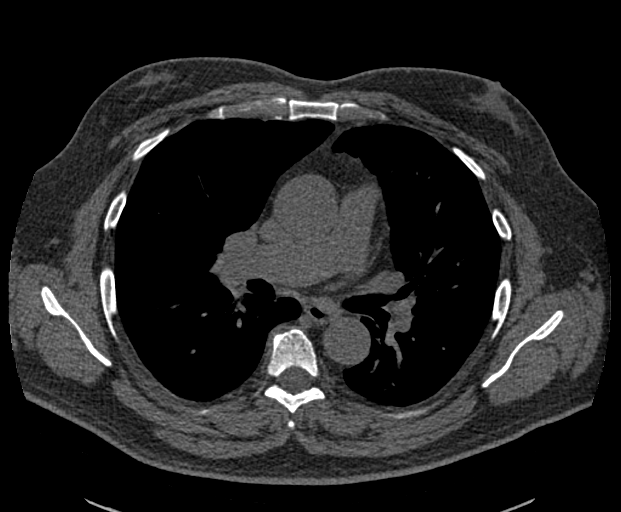
[im 58/70  lung]
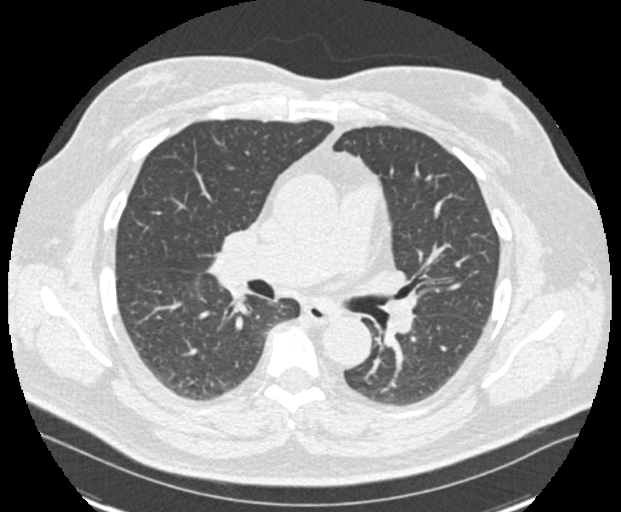

[Series 9: calcium scoring 2.00 br60 bestdiast 71% lungs · axial · 0.68mm/px · z∈[+1490,+1582]mm · 5 of 70 slices shown]
[im 12/70  vessel]
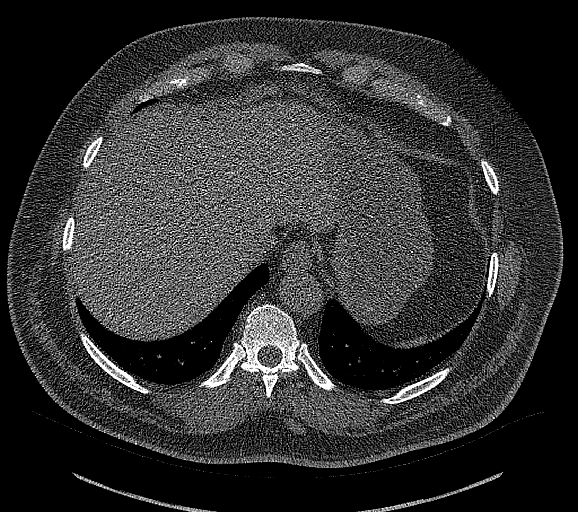
[im 24/70  vessel]
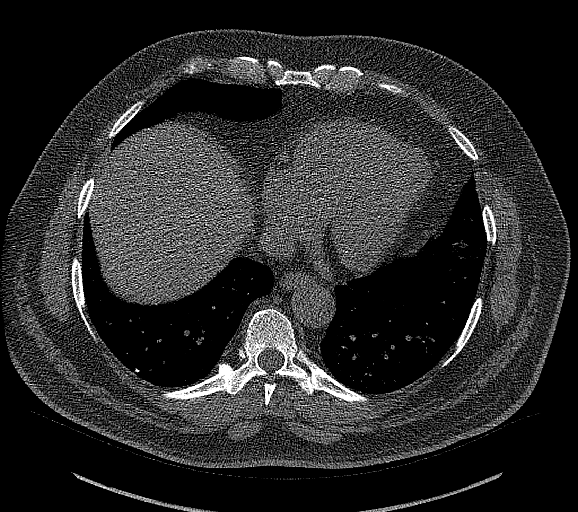
[im 35/70  vessel]
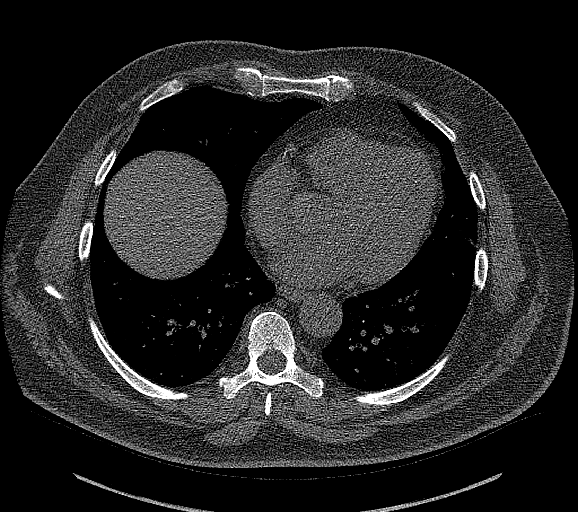
[im 47/70  vessel]
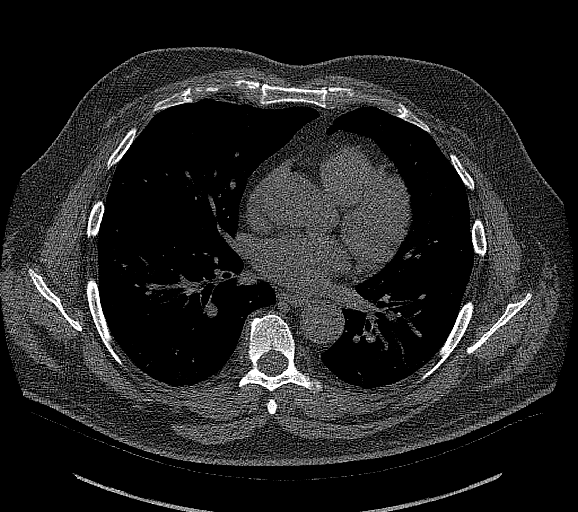
[im 58/70  vessel]
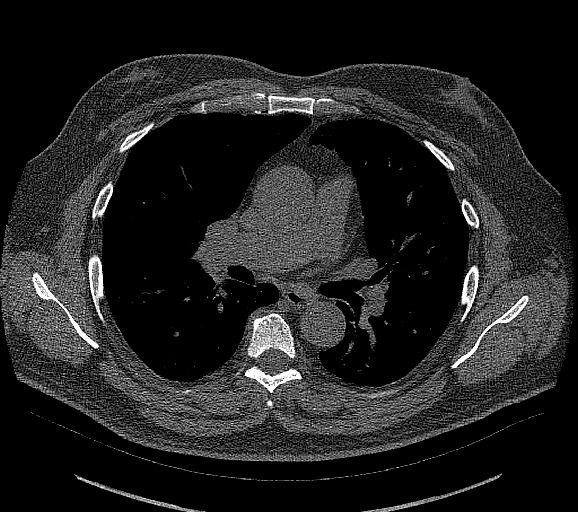

[14 of 20 positions shown; findings below may reference images not displayed]

FINDINGS: CORONARY CALCIUM SCORES:

Left Main:

LAD: 225

LCx:

RCA: 168

Total Agatston Score: 467

[HOSPITAL] percentile: 89

AORTA MEASUREMENTS:

Ascending Aorta: 38 mm

Descending Aorta: 29 mm

OTHER FINDINGS:

The heart size is within normal limits. No pericardial fluid is
identified. Visualized segments of the thoracic aorta and central
pulmonary arteries are normal in caliber. Visualized mediastinum and
hilar regions demonstrate no lymphadenopathy or masses. Visualized
lungs show no evidence of pulmonary edema, consolidation,
pneumothorax, nodule or pleural fluid. Visualized upper abdomen and
bony structures are unremarkable.
IMPRESSION: Coronary calcium score of 467 is at the 89th percentile for the
patient's age, sex and race.

## 2024-02-11 DIAGNOSIS — H60311 Diffuse otitis externa, right ear: Secondary | ICD-10-CM | POA: Diagnosis not present

## 2024-02-17 DIAGNOSIS — H60501 Unspecified acute noninfective otitis externa, right ear: Secondary | ICD-10-CM | POA: Diagnosis not present

## 2024-02-17 DIAGNOSIS — H66001 Acute suppurative otitis media without spontaneous rupture of ear drum, right ear: Secondary | ICD-10-CM | POA: Diagnosis not present

## 2024-02-24 DIAGNOSIS — H9191 Unspecified hearing loss, right ear: Secondary | ICD-10-CM | POA: Diagnosis not present

## 2024-02-24 DIAGNOSIS — H7291 Unspecified perforation of tympanic membrane, right ear: Secondary | ICD-10-CM | POA: Diagnosis not present

## 2024-02-27 DIAGNOSIS — H938X1 Other specified disorders of right ear: Secondary | ICD-10-CM | POA: Diagnosis not present

## 2024-02-27 DIAGNOSIS — H9201 Otalgia, right ear: Secondary | ICD-10-CM | POA: Diagnosis not present

## 2024-02-28 DIAGNOSIS — H938X1 Other specified disorders of right ear: Secondary | ICD-10-CM | POA: Diagnosis not present

## 2024-02-28 DIAGNOSIS — H9011 Conductive hearing loss, unilateral, right ear, with unrestricted hearing on the contralateral side: Secondary | ICD-10-CM | POA: Diagnosis not present

## 2024-03-02 ENCOUNTER — Institutional Professional Consult (permissible substitution) (INDEPENDENT_AMBULATORY_CARE_PROVIDER_SITE_OTHER)

## 2024-03-03 ENCOUNTER — Other Ambulatory Visit (HOSPITAL_COMMUNITY): Payer: Self-pay | Admitting: Otolaryngology

## 2024-03-03 DIAGNOSIS — H938X1 Other specified disorders of right ear: Secondary | ICD-10-CM

## 2024-03-05 ENCOUNTER — Ambulatory Visit (HOSPITAL_COMMUNITY)
Admission: RE | Admit: 2024-03-05 | Discharge: 2024-03-05 | Disposition: A | Discharge status: Home / Self Care | Source: Ambulatory Visit | Attending: Otolaryngology | Admitting: Otolaryngology

## 2024-03-05 DIAGNOSIS — H938X1 Other specified disorders of right ear: Secondary | ICD-10-CM | POA: Diagnosis not present

## 2024-03-05 MED ORDER — IOHEXOL 300 MG/ML  SOLN
75.0000 mL | Freq: Once | INTRAMUSCULAR | Status: AC | PRN
  Administered 2024-03-05: 75 mL via INTRAVENOUS

## 2024-03-05 MED ORDER — SODIUM CHLORIDE (PF) 0.9 % IJ SOLN
INTRAMUSCULAR | Status: AC
  Filled 2024-03-05: qty 50

## 2024-03-18 ENCOUNTER — Institutional Professional Consult (permissible substitution) (INDEPENDENT_AMBULATORY_CARE_PROVIDER_SITE_OTHER)

## 2024-03-30 NOTE — Progress Notes (Signed)
 KOBEY SIDES                                          MRN: [id number]   03/30/2024   The [facility] Quality Team Specialist reviewed this patient medical record for the purposes of chart review for care gap closure. The following were reviewed: chart review for care gap closure-kidney health evaluation for diabetes:eGFR  and uACR.    [facility] Quality Team
